# Patient Record
Sex: Female | Born: 1935 | Race: White | Hispanic: No
Health system: Southern US, Community
[De-identification: ages and names within clinical notes are randomized; demographics above are authoritative.]

---

## 2003-06-08 ENCOUNTER — Ambulatory Visit (HOSPITAL_COMMUNITY): Admission: RE | Admit: 2003-06-08 | Discharge: 2003-06-08 | Payer: Self-pay | Admitting: Neurology

## 2004-12-01 ENCOUNTER — Ambulatory Visit: Payer: Self-pay | Admitting: Family Medicine

## 2005-04-02 ENCOUNTER — Emergency Department: Payer: Self-pay | Admitting: Emergency Medicine

## 2005-12-08 ENCOUNTER — Ambulatory Visit: Payer: Self-pay | Admitting: Orthopaedic Surgery

## 2006-05-23 ENCOUNTER — Ambulatory Visit: Payer: Self-pay | Admitting: Ophthalmology

## 2006-05-24 ENCOUNTER — Other Ambulatory Visit: Payer: Self-pay

## 2006-05-24 ENCOUNTER — Inpatient Hospital Stay: Payer: Self-pay | Admitting: Internal Medicine

## 2006-07-06 ENCOUNTER — Inpatient Hospital Stay: Payer: Self-pay | Admitting: Internal Medicine

## 2006-07-06 ENCOUNTER — Other Ambulatory Visit: Payer: Self-pay

## 2006-07-07 ENCOUNTER — Other Ambulatory Visit: Payer: Self-pay

## 2006-10-17 ENCOUNTER — Emergency Department: Payer: Self-pay | Admitting: Emergency Medicine

## 2006-10-17 ENCOUNTER — Other Ambulatory Visit: Payer: Self-pay

## 2006-10-19 ENCOUNTER — Ambulatory Visit: Payer: Self-pay | Admitting: Neurology

## 2006-10-20 ENCOUNTER — Ambulatory Visit: Payer: Self-pay | Admitting: Unknown Physician Specialty

## 2006-11-20 ENCOUNTER — Ambulatory Visit: Payer: Self-pay | Admitting: Unknown Physician Specialty

## 2007-07-19 ENCOUNTER — Ambulatory Visit: Payer: Self-pay | Admitting: Unknown Physician Specialty

## 2007-11-05 ENCOUNTER — Ambulatory Visit: Payer: Self-pay

## 2007-11-20 ENCOUNTER — Ambulatory Visit: Payer: Self-pay | Admitting: Unknown Physician Specialty

## 2007-12-25 ENCOUNTER — Ambulatory Visit: Payer: Self-pay | Admitting: Pain Medicine

## 2008-03-15 ENCOUNTER — Emergency Department: Payer: Self-pay | Admitting: Emergency Medicine

## 2008-05-01 ENCOUNTER — Ambulatory Visit: Payer: Self-pay | Admitting: Internal Medicine

## 2008-06-14 ENCOUNTER — Emergency Department: Payer: Self-pay | Admitting: Emergency Medicine

## 2008-08-08 ENCOUNTER — Ambulatory Visit: Payer: Self-pay | Admitting: Unknown Physician Specialty

## 2009-03-19 ENCOUNTER — Ambulatory Visit: Payer: Self-pay | Admitting: Ophthalmology

## 2009-03-19 ENCOUNTER — Ambulatory Visit: Payer: Self-pay | Admitting: Cardiology

## 2009-04-07 ENCOUNTER — Ambulatory Visit: Payer: Self-pay | Admitting: Ophthalmology

## 2009-06-15 ENCOUNTER — Ambulatory Visit: Payer: Self-pay | Admitting: Ophthalmology

## 2009-06-29 ENCOUNTER — Ambulatory Visit: Payer: Self-pay | Admitting: Ophthalmology

## 2010-04-08 ENCOUNTER — Inpatient Hospital Stay: Payer: Self-pay | Admitting: Internal Medicine

## 2010-04-15 ENCOUNTER — Emergency Department: Payer: Self-pay | Admitting: Emergency Medicine

## 2010-04-24 ENCOUNTER — Encounter: Payer: Self-pay | Admitting: Neurology

## 2010-05-04 ENCOUNTER — Emergency Department: Payer: Self-pay | Admitting: Emergency Medicine

## 2011-05-08 ENCOUNTER — Emergency Department: Payer: Self-pay | Admitting: *Deleted

## 2011-06-12 IMAGING — CR PELVIS - 1-2 VIEW
1 series · 1 of 1 positions shown · non-contrast
Comparison: none

REASON FOR EXAM: fall apparent r hip /thigh pain
COMMENTS:

[view not recorded]
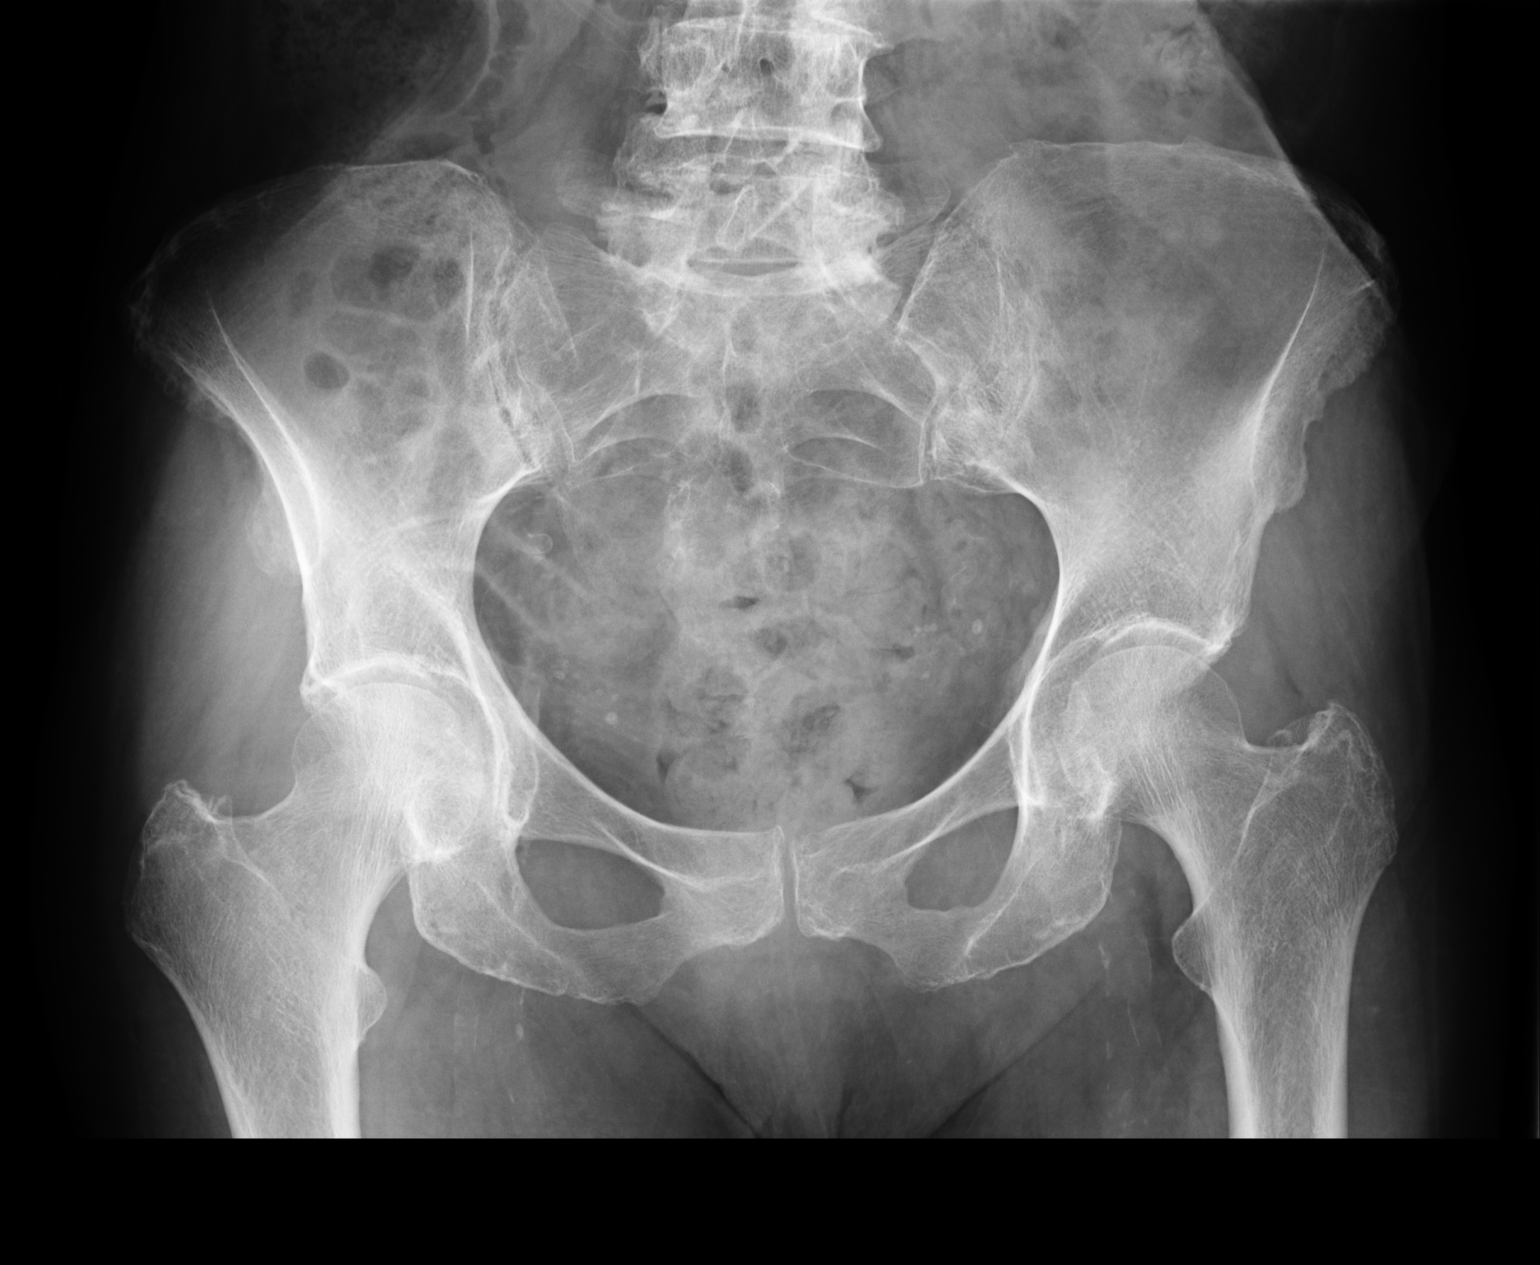

[1 of 1 positions shown; findings below may reference images not displayed]

PROCEDURE:     DXR - DXR PELVIS AP ONLY  - May 04, 2010  [DATE]

RESULT:     An AP view of the bony pelvis is compared to a prior exam of
04/15/2010. No fracture or other acute bony abnormality. There is a bony
prominence along the lateral aspect of the right ilium compatible with a
muscle attachment site. The hip joint spaces are well-maintained. No lytic
or blastic lesions are seen.
IMPRESSION: 1.     No acute changes are identified.

## 2011-11-11 LAB — COMPREHENSIVE METABOLIC PANEL
Alkaline Phosphatase: 93 U/L (ref 50–136)
Anion Gap: 6 — ABNORMAL LOW (ref 7–16)
Calcium, Total: 9.1 mg/dL (ref 8.5–10.1)
Chloride: 119 mmol/L — ABNORMAL HIGH (ref 98–107)
Co2: 29 mmol/L (ref 21–32)
Creatinine: 0.95 mg/dL (ref 0.60–1.30)
EGFR (Non-African Amer.): 55 — ABNORMAL LOW
Osmolality: 315 (ref 275–301)
SGOT(AST): 98 U/L — ABNORMAL HIGH (ref 15–37)
SGPT (ALT): 96 U/L — ABNORMAL HIGH (ref 12–78)

## 2011-11-11 LAB — TROPONIN I
Troponin-I: 0.04 ng/mL
Troponin-I: 0.03 ng/mL

## 2011-11-11 LAB — CBC WITH DIFFERENTIAL/PLATELET
Basophil %: 0.5 %
Eosinophil %: 1.1 %
HGB: 16.2 g/dL — ABNORMAL HIGH (ref 12.0–16.0)
Lymphocyte #: 2.2 10*3/uL (ref 1.0–3.6)
MCH: 31.7 pg (ref 26.0–34.0)
MCV: 91 fL (ref 80–100)
Monocyte #: 0.7 x10 3/mm (ref 0.2–0.9)
Neutrophil #: 6.3 10*3/uL (ref 1.4–6.5)
Neutrophil %: 67.4 %
Platelet: 171 10*3/uL (ref 150–440)
RBC: 5.12 10*6/uL (ref 3.80–5.20)

## 2011-11-12 ENCOUNTER — Inpatient Hospital Stay: Payer: Self-pay | Admitting: Internal Medicine

## 2011-11-12 LAB — POTASSIUM: Potassium: 4 mmol/L (ref 3.5–5.1)

## 2011-11-13 LAB — BASIC METABOLIC PANEL
Calcium, Total: 8.4 mg/dL — ABNORMAL LOW (ref 8.5–10.1)
Chloride: 118 mmol/L — ABNORMAL HIGH (ref 98–107)
Co2: 26 mmol/L (ref 21–32)
EGFR (African American): >60
EGFR (Non-African Amer.): >60
Glucose: 95 mg/dL (ref 65–99)
Osmolality: 305 (ref 275–301)
Potassium: 3.6 mmol/L (ref 3.5–5.1)
Sodium: 151 mmol/L — ABNORMAL HIGH (ref 136–145)

## 2011-11-13 LAB — HEPATIC FUNCTION PANEL A (ARMC)
Alkaline Phosphatase: 83 U/L (ref 50–136)
SGOT(AST): 26 U/L (ref 15–37)
SGPT (ALT): 48 U/L (ref 12–78)

## 2011-11-14 LAB — BASIC METABOLIC PANEL
Anion Gap: 7 (ref 7–16)
BUN: 17 mg/dL (ref 7–18)
Chloride: 108 mmol/L — ABNORMAL HIGH (ref 98–107)
Co2: 27 mmol/L (ref 21–32)
Creatinine: 0.88 mg/dL (ref 0.60–1.30)
EGFR (African American): >60
EGFR (Non-African Amer.): >60
Osmolality: 285 (ref 275–301)

## 2012-02-03 ENCOUNTER — Ambulatory Visit: Payer: Self-pay | Admitting: Internal Medicine

## 2012-02-24 ENCOUNTER — Inpatient Hospital Stay: Payer: Self-pay | Admitting: Student

## 2012-02-24 LAB — CBC
MCHC: 34.5 g/dL (ref 32.0–36.0)
Platelet: 171 10*3/uL (ref 150–440)
RBC: 5.3 10*6/uL — ABNORMAL HIGH (ref 3.80–5.20)
RDW: 13 % (ref 11.5–14.5)
WBC: 21.1 10*3/uL — ABNORMAL HIGH (ref 3.6–11.0)

## 2012-02-24 LAB — COMPREHENSIVE METABOLIC PANEL
Albumin: 3.9 g/dL (ref 3.4–5.0)
Alkaline Phosphatase: 91 U/L (ref 50–136)
BUN: 31 mg/dL — ABNORMAL HIGH (ref 7–18)
Bilirubin,Total: 1.8 mg/dL — ABNORMAL HIGH (ref 0.2–1.0)
Calcium, Total: 9.9 mg/dL (ref 8.5–10.1)
Co2: 29 mmol/L (ref 21–32)
Creatinine: 1.28 mg/dL (ref 0.60–1.30)
EGFR (Non-African Amer.): 39 — ABNORMAL LOW
Osmolality: 297 (ref 275–301)
SGPT (ALT): 23 U/L (ref 12–78)
Sodium: 144 mmol/L (ref 136–145)

## 2012-02-24 LAB — CK TOTAL AND CKMB (NOT AT ARMC)
CK, Total: 24 U/L (ref 21–215)
CK-MB: <0.5 ng/mL — ABNORMAL LOW (ref 0.5–3.6)

## 2012-02-24 LAB — URINALYSIS, COMPLETE
Nitrite: NEGATIVE
Ph: 8 (ref 4.5–8.0)
Specific Gravity: 1.016 (ref 1.003–1.030)
Transitional Epi: 1

## 2012-02-25 LAB — CBC WITH DIFFERENTIAL/PLATELET
Basophil #: 0 10*3/uL (ref 0.0–0.1)
Eosinophil #: 0.1 10*3/uL (ref 0.0–0.7)
HCT: 40.6 % (ref 35.0–47.0)
Lymphocyte #: 1.6 10*3/uL (ref 1.0–3.6)
Lymphocyte %: 12.3 %
MCH: 32.1 pg (ref 26.0–34.0)
MCHC: 35.5 g/dL (ref 32.0–36.0)
MCV: 91 fL (ref 80–100)
Monocyte #: 0.9 x10 3/mm (ref 0.2–0.9)
Monocyte %: 7.1 %
Neutrophil #: 10.3 10*3/uL — ABNORMAL HIGH (ref 1.4–6.5)
Platelet: 134 10*3/uL — ABNORMAL LOW (ref 150–440)
RDW: 12.8 % (ref 11.5–14.5)
WBC: 12.9 10*3/uL — ABNORMAL HIGH (ref 3.6–11.0)

## 2012-02-25 LAB — MAGNESIUM: Magnesium: 2.1 mg/dL

## 2012-02-26 LAB — URINE CULTURE

## 2012-02-28 ENCOUNTER — Inpatient Hospital Stay: Payer: Self-pay | Admitting: Student

## 2012-02-28 LAB — URINALYSIS, COMPLETE
Ketone: NEGATIVE
Nitrite: NEGATIVE
Ph: 5 (ref 4.5–8.0)
Protein: 30
Squamous Epithelial: NONE SEEN

## 2012-02-28 LAB — CBC
HCT: 45.7 % (ref 35.0–47.0)
HGB: 15.4 g/dL (ref 12.0–16.0)
MCH: 30.6 pg (ref 26.0–34.0)
MCV: 91 fL (ref 80–100)
RBC: 5.02 10*6/uL (ref 3.80–5.20)
RDW: 12.9 % (ref 11.5–14.5)

## 2012-02-28 LAB — MAGNESIUM: Magnesium: 2.5 mg/dL — ABNORMAL HIGH

## 2012-02-28 LAB — CK TOTAL AND CKMB (NOT AT ARMC): CK-MB: <0.5 ng/mL — ABNORMAL LOW (ref 0.5–3.6)

## 2012-02-28 LAB — COMPREHENSIVE METABOLIC PANEL
Anion Gap: 8 (ref 7–16)
Bilirubin,Total: 0.9 mg/dL (ref 0.2–1.0)
Calcium, Total: 9.4 mg/dL (ref 8.5–10.1)
Chloride: 120 mmol/L — ABNORMAL HIGH (ref 98–107)
Co2: 23 mmol/L (ref 21–32)
EGFR (African American): 48 — ABNORMAL LOW
EGFR (Non-African Amer.): 41 — ABNORMAL LOW
Osmolality: 313 (ref 275–301)
SGOT(AST): 19 U/L (ref 15–37)
Sodium: 151 mmol/L — ABNORMAL HIGH (ref 136–145)

## 2012-02-28 LAB — TROPONIN I: Troponin-I: <0.02 ng/mL

## 2012-02-28 LAB — APTT: Activated PTT: 26.6 secs (ref 23.6–35.9)

## 2012-02-28 LAB — LIPASE, BLOOD: Lipase: 105 U/L (ref 73–393)

## 2012-02-28 LAB — PROTIME-INR: INR: 1.1

## 2012-02-29 LAB — CBC WITH DIFFERENTIAL/PLATELET
Basophil #: 0.1 10*3/uL (ref 0.0–0.1)
Eosinophil #: 0.1 10*3/uL (ref 0.0–0.7)
Eosinophil %: 1.2 %
HGB: 13.1 g/dL (ref 12.0–16.0)
Lymphocyte #: 1.7 10*3/uL (ref 1.0–3.6)
MCH: 31.9 pg (ref 26.0–34.0)
MCHC: 35.3 g/dL (ref 32.0–36.0)
Neutrophil #: 6.6 10*3/uL — ABNORMAL HIGH (ref 1.4–6.5)
Neutrophil %: 70.1 %
Platelet: 124 10*3/uL — ABNORMAL LOW (ref 150–440)
RBC: 4.12 10*6/uL (ref 3.80–5.20)
RDW: 12.7 % (ref 11.5–14.5)

## 2012-02-29 LAB — COMPREHENSIVE METABOLIC PANEL
Alkaline Phosphatase: 58 U/L (ref 50–136)
Anion Gap: 6 — ABNORMAL LOW (ref 7–16)
BUN: 26 mg/dL — ABNORMAL HIGH (ref 7–18)
Calcium, Total: 8.7 mg/dL (ref 8.5–10.1)
Chloride: 120 mmol/L — ABNORMAL HIGH (ref 98–107)
Co2: 24 mmol/L (ref 21–32)
EGFR (African American): >60
EGFR (Non-African Amer.): >60
Osmolality: 304 (ref 275–301)
Potassium: 3.5 mmol/L (ref 3.5–5.1)
SGOT(AST): 19 U/L (ref 15–37)
Sodium: 150 mmol/L — ABNORMAL HIGH (ref 136–145)

## 2012-02-29 LAB — URINE CULTURE

## 2012-03-01 LAB — BASIC METABOLIC PANEL
Anion Gap: 6 — ABNORMAL LOW (ref 7–16)
BUN: 22 mg/dL — ABNORMAL HIGH (ref 7–18)
Chloride: 114 mmol/L — ABNORMAL HIGH (ref 98–107)
Co2: 25 mmol/L (ref 21–32)
Creatinine: 0.74 mg/dL (ref 0.60–1.30)
EGFR (African American): >60
Glucose: 123 mg/dL — ABNORMAL HIGH (ref 65–99)
Osmolality: 293 (ref 275–301)

## 2012-03-01 LAB — CULTURE, BLOOD (SINGLE)

## 2012-03-04 ENCOUNTER — Ambulatory Visit: Payer: Self-pay | Admitting: Internal Medicine

## 2012-03-05 LAB — CULTURE, BLOOD (SINGLE)

## 2012-05-05 DEATH — deceased

## 2013-04-04 IMAGING — CR DG CHEST 2V
1 series · 2 of 2 positions shown · non-contrast
Comparison: none

REASON FOR EXAM: fever
COMMENTS:

[Series 1: x chest ap · 0.14mm/px · 2 of 2 slices shown]
[im 1/2]
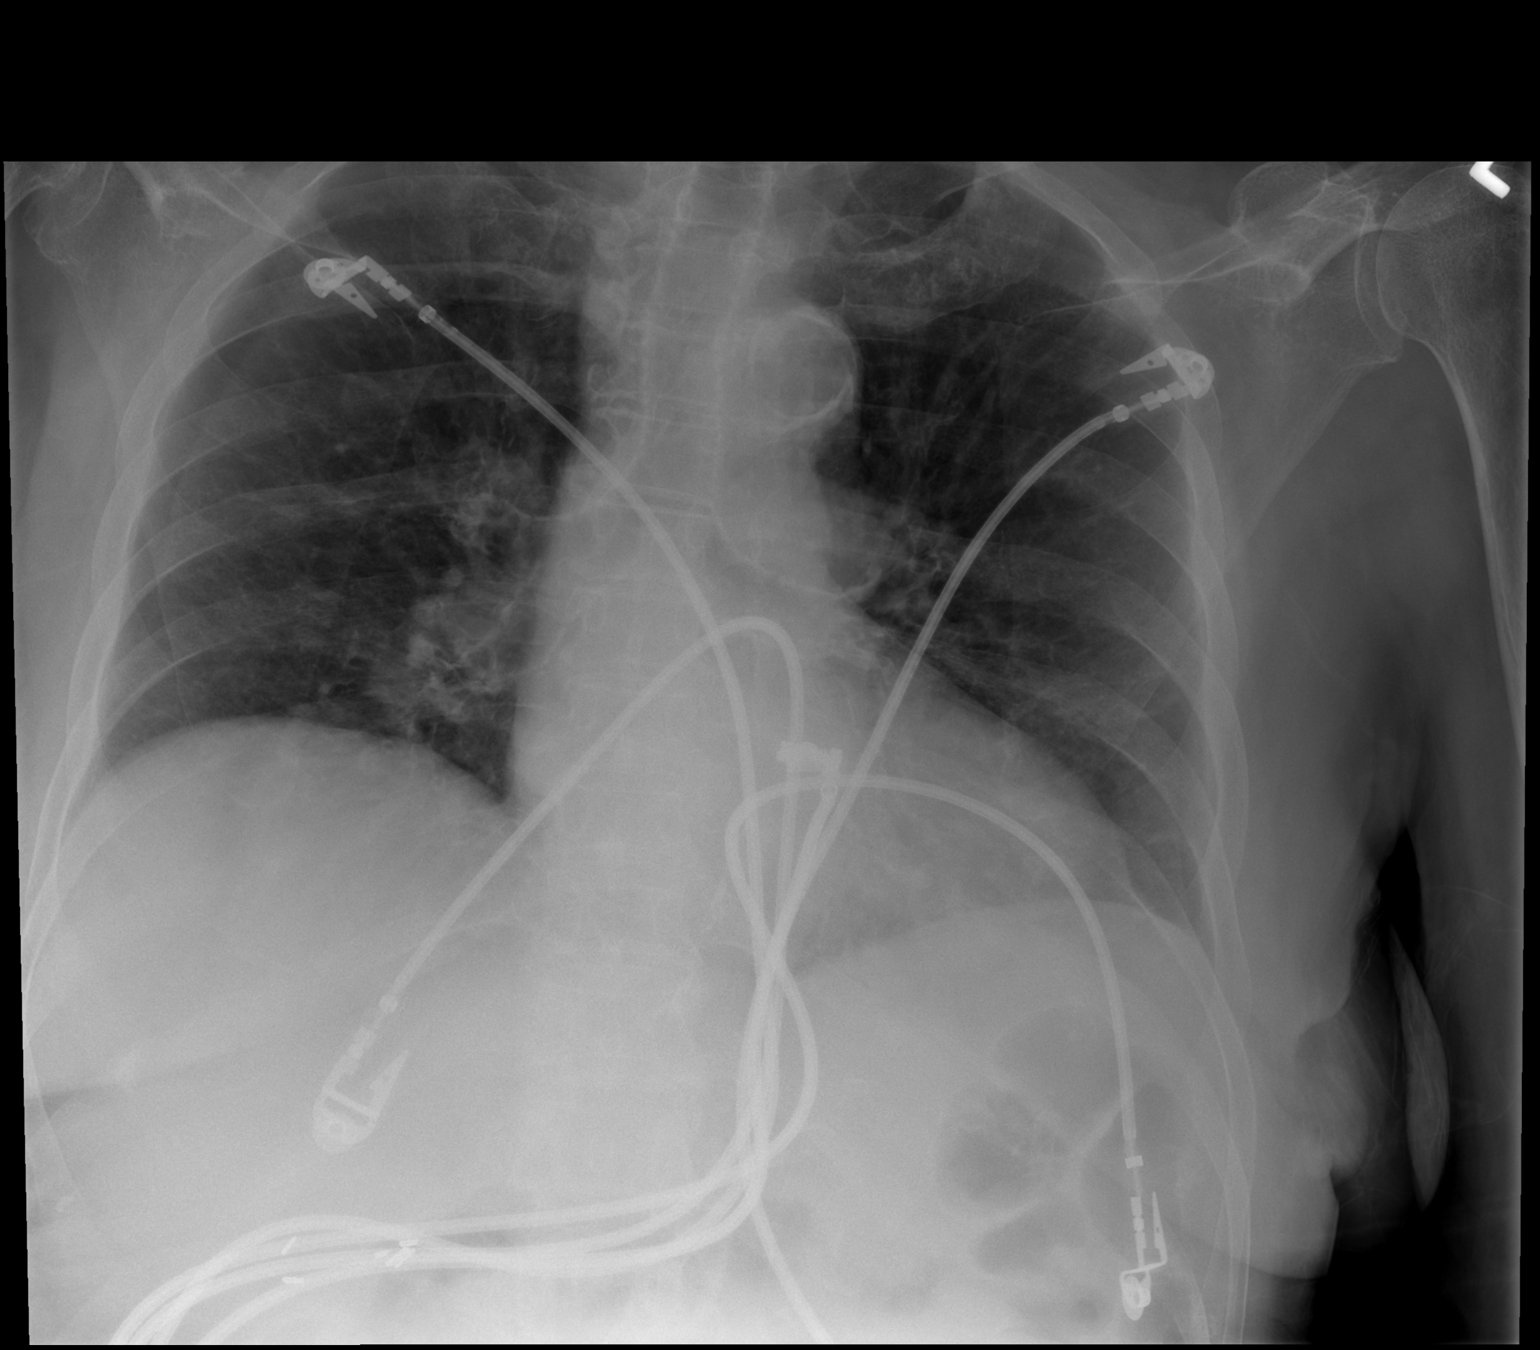
[im 2/2]
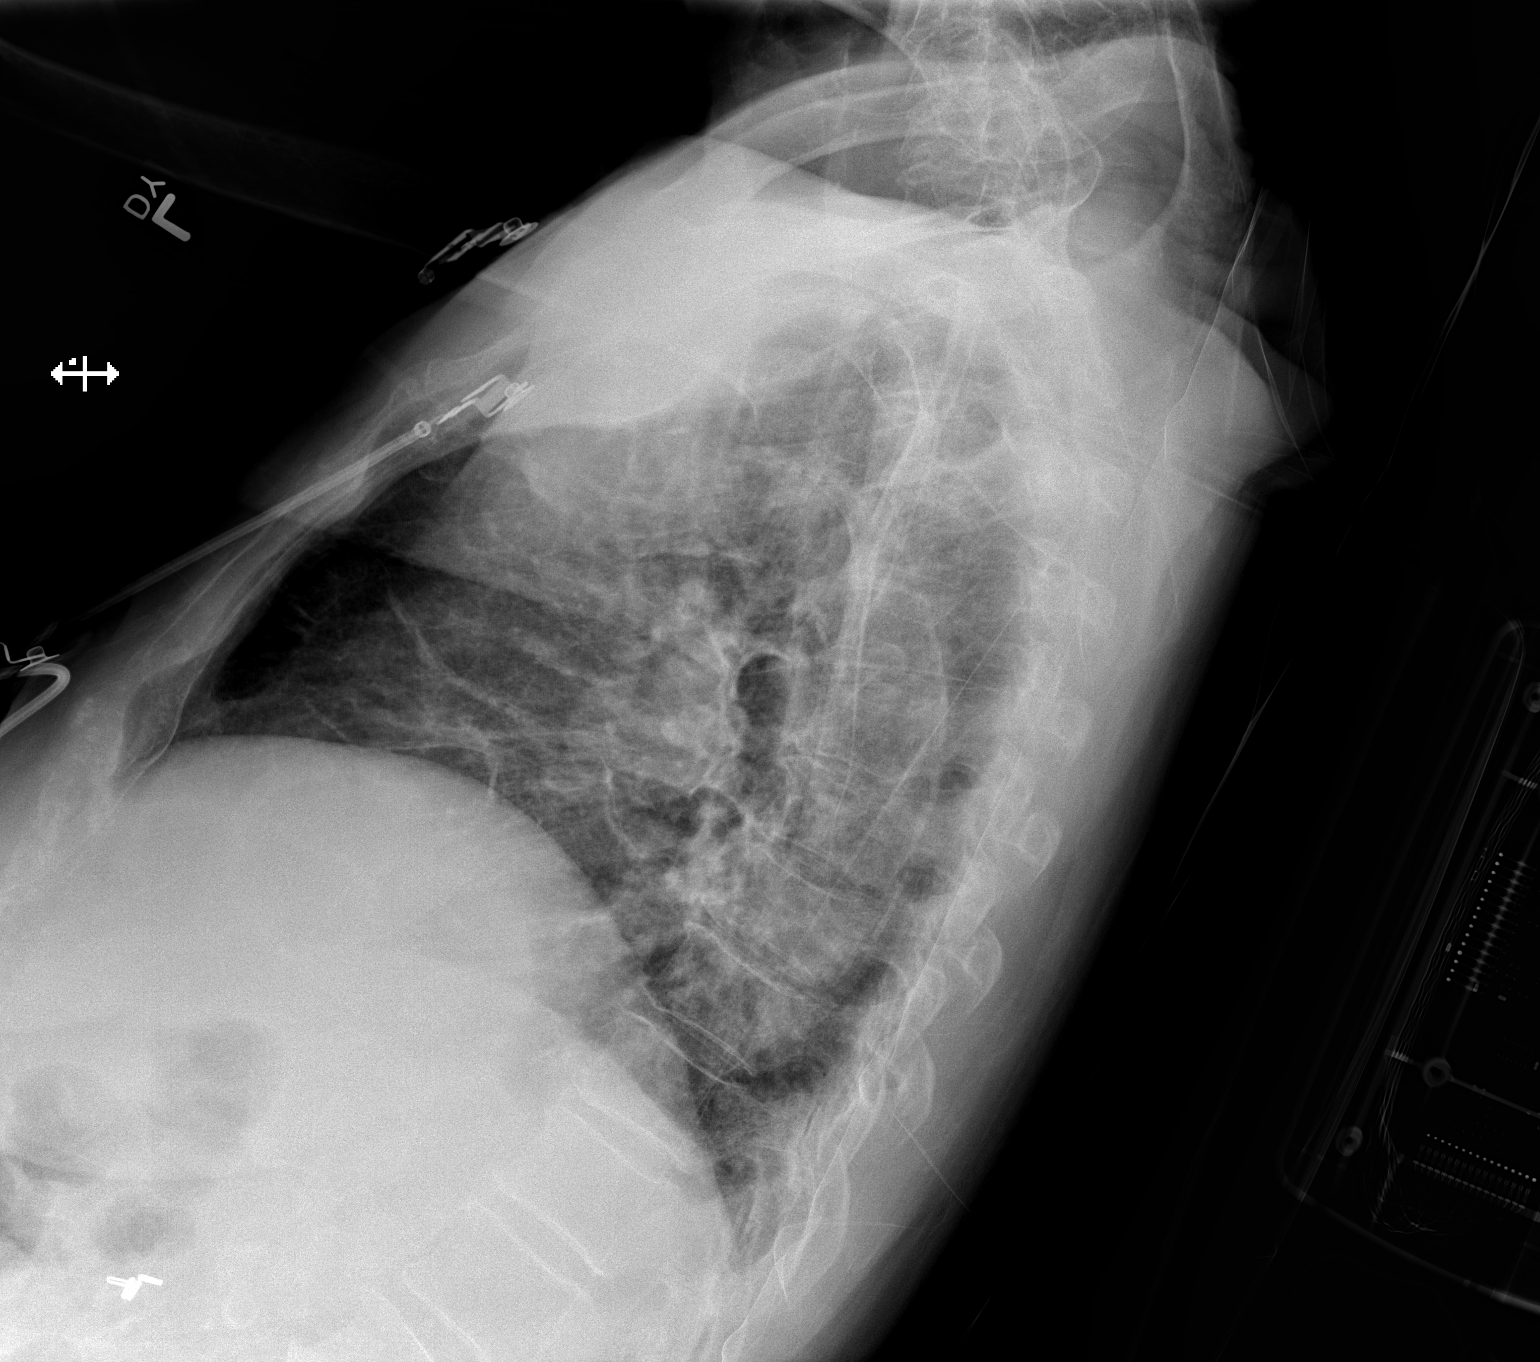

[2 of 2 positions shown; findings below may reference images not displayed]

PROCEDURE:     DXR - DXR CHEST PA (OR AP) AND LATERAL  - February 25, 2012  [DATE]

RESULT:     Comparison is made to the study 24 February, 2012.

Prominent atherosclerotic calcification is present. The cardiac silhouette
appears normal. Cardiac monitoring electrodes are present. There is no overt
edema, infiltrate, effusion or pneumothorax.
IMPRESSION: 1. No acute cardiopulmonary disease. Atherosclerotic changes are present.

[REDACTED]

## 2014-07-22 NOTE — Discharge Summary (Signed)
PATIENT NAMESHERLENE, [name] MR#:  [id number] DATE OF BIRTH:  1926/08/30  DATE OF ADMISSION:  02/24/2012 DATE OF DISCHARGE:  02/26/2012  CHIEF COMPLAINT: Fever, vomiting, altered mental status.   PRIMARY CARE PHYSICIAN: Dr. Lavetta Nielsen with [facility]    DISCHARGE DIAGNOSES:  1. Sepsis urinary tract infection with Proteus organism.  2. Atrial fibrillation with RVR.  3. Hypertension.  4. Advanced dementia.  5. Hypothyroidism  6. Osteoarthritis. 7. Allergic rhinitis.  8. Hyperlipidemia. 9. Fibromyalgia. 10. Chronic fatigue syndrome.  11. History of recurrent atrial fibrillation.   DISCHARGE MEDICATIONS:  1. Sertraline 25 mg daily.   2. Metoprolol tartrate 75 mg 2 times a day.  3. Levothyroxine 88 mcg daily.  4. Tikosyn 250 mcg one cap every 12 hours.  5. Senna lax 1 tab 2 to 3 times a week as needed for stool softener. 6. Tiazac 180 mg 24 hours extended-release 1 tab daily at bedtime.  7. Ensure Plus three times a day.  8. Levaquin 250 mg 1 tab every 24 hours x2 days only.   DO NOT TAKE: Please stop taking Diflucan.  DIET: Low sodium, mechanical soft, honey thick liquids.   ACTIVITY: As tolerated.   FOLLOW-UP: Please follow with [facility] physician in 1 to 2 days and follow with your cardiologist within 1 to 2 weeks.   HISTORY OF PRESENT ILLNESS: For full details of history and physical, please see the dictation by Dr. Sherryll Burger on 02/24/2012. Briefly, this is patient with known history of Alzheimer's dementia, hypertension, hypothyroidism, and atrial fibrillation who presented with the above chief complaint. The patient had some nausea and vomiting and was more altered than her baseline and, therefore, was admitted to the hospitalist service. The patient had a temperature of 100.5 on arrival and had dirty urine, was admitted to the hospitalist service.   LABS AND IMAGING: Initial BUN 31, creatinine 1.28. LFTs bilirubin 1.3, otherwise within normal limits. Troponin negative  x1. TSH 1.37. Initial WBC 21.1. On November 23rd WBC was 12.9. Urine cultures were growing Proteus mirabilis sensitive to Levaquin. Blood cultures have been no growth to date x2 from November 22nd. Urinalysis on arrival 3+ leukocyte esterase, 890 WBCs, 3+ bacteria, 256 RBCs.  X-ray of the chest, PA and lateral, on November 23rd no acute cardiopulmonary disease.   HOSPITAL COURSE: The patient was admitted to the hospitalist service. Blood cultures and urine cultures were sent on arrival and the patient was started on Levaquin. The patient's blood cultures have been no growth to date and urine cultures came back with mirabilis which is susceptible to the Levaquin which has been started prior. Today is day three of the Levaquin and she has had a dose already. Will treat her for five days with Levaquin. Her fever and leukocytosis resolved. The patient has very advanced dementia and is being followed by [facility] which whom she should follow-up as an outpatient. The patient did have sepsis per criteria. Blood pressure remained stable. The patient did have a bout of atrial fibrillation with RVR which needed diltiazem IV x1. Currently she is back to sinus and she is instructed to follow with her outpatient cardiologist which manages the Tikosyn. The patient is on Tikosyn, metoprolol, and Cardizem.   OVERALL PROGNOSIS: Poor with advanced dementia.  CODE STATUS: The patient is a FULL CODE.       TOTAL TIME SPENT: 45 minutes.   ____________________________ Krystal Eaton, MD sa:drc D: 02/26/2012 14:20:34 ET T: 02/27/2012 12:14:15 ET JOB#: [id number]  cc: [email or web address]  Jacques NavyAhmadzia, MD, <Dictator> Kristopher OppenheimJane D. Shawn StallHollingsworth, MD Krystal EatonSHAYIQ Jaretzy Lhommedieu MD ELECTRONICALLY SIGNED 03/15/2012 11:09

## 2014-07-22 NOTE — H&P (Signed)
PATIENT NAME:  [name], [name] MR#:  [id number] DATE OF BIRTH:  12-02-1926  DATE OF ADMISSION:  02/24/2012  PRIMARY CARE PHYSICIAN: Dr. Lavetta [facility]  REQUESTING PHYSICIAN: Dr. Carollee Massed   CHIEF COMPLAINT: Fever, vomiting, and altered mental status.   HISTORY OF PRESENT ILLNESS: The patient is an 81 year old female with a known history of Alzheimer's dementia, hypertension, and hypothyroidism being admitted for SIRS likely due to urinary tract infection.  The patient was prescribed Diflucan yesterday by her PCP for possible urinary tract infection, for possible yeast infection in the urine, but had worsening altered mental status today. As per daughter, the patient was becoming more restless and she was unable to arouse her this morning. The patient also had some vomiting. EMS was called and she was brought into the Emergency Department. As per the record, the patient had a fever for last three days as well. While in the ED, she was found to have leukocytosis with a white count of 21.1. Her urinalysis looks infected and she is being admitted for further evaluation and management. She also had a temperature 100.5 while in the ED.   PAST MEDICAL HISTORY:  1. Alzheimer's dementia. 2. Hypertension.  3. Hypothyroidism.  4. Osteoarthritis. 5. Allergic rhinitis. 6. Hyperlipidemia.  7. Fibromyalgia. 8. Chronic fatigue syndrome. 9. History of recurrent atrial fibrillation.   REVIEW OF SYSTEMS: Unobtainable secondary to the patient's severe dementia, which is at her baseline.   PAST SURGICAL HISTORY:  1. Cholecystectomy. 2. Hysterectomy. 3. Bilateral carpal tunnel repair. 4. Cataract surgery.   SOCIAL HISTORY: Former smoker. She did not smoke for many years. No alcoholism. She lives at home with her daughter. She is followed by [facility]. She is widowed.    FAMILY HISTORY: Father died from prostate cancer. Mother died from unknown cancer at the age of 57.    ALLERGIES:  Dilaudid, sulfa, Sudafed, and penicillin.   MEDICATIONS AT HOME:  1. Tikosyn 250 mcg p.o. b.i.d.  2. Diflucan 100 mg p.o. daily which was started yesterday.  3. Ensure Plus 3 times a day.  4. Levothyroxine 88 mcg p.o. daily.  5. Metoprolol 50 mg, 1-1/2 tablets p.o. b.i.d.  6. Senna-Lax 1 tablet p.o. 2 to 3 times a week as needed.  7. Sertraline 25 mg p.o. daily.  8. Tiazac 180 mg p.o. at bedtime.   PHYSICAL EXAMINATION:  VITAL SIGNS: Temperature 100.4, heart rate 76 per minute, respirations 18 per minute, blood pressure 137/69 mmHg. She is saturating 98% on room air.   GENERAL: The patient is an 81 year old female lying in bed, nonverbal. She is in no acute distress.   EYES:  Pupils equal, round, reactive to light and accommodation. No scleral icterus. Extraocular muscles intact.   HENT: Head atraumatic, normocephalic. Oropharynx and nasopharynx clear.   NECK:  Supple.  No jugular venous distention. No thyroid enlargement or tenderness.   LUNGS: Clear to auscultation bilaterally. No wheezing, rales, rhonchi, or crepitation.   CARDIOVASCULAR: S1, S2 normal. No murmurs, rubs, or gallops.   ABDOMEN: Soft, nontender, nondistended. Bowel sounds present. No organomegaly.  EXTREMITIES:  No pedal edema, cyanosis, or clubbing.   NEUROLOGIC: Nonfocal examination She is very demented, makes physical examination difficult for full neuro evaluation.  She did state she feels okay once.   PSYCHIATRIC: Unable to evaluate as she has advanced dementia. She is alert but not oriented.   LABORATORY, DIAGNOSTIC, AND RADIOLOGICAL DATA: Normal BMP. Normal liver function tests except total bilirubin 1.8. Normal first set  of cardiac enzymes. CBC showed white count 21.1, hemoglobin 16.6, hematocrit 48.2, platelets 171. Urinalysis showed 3+ bacteria, 890 WBCs, 3+ leukocyte esterase, negative nitrite, WBCs in clumps present. Lactic acid of 2.2.   Chest x-ray while in the Emergency Department  showed no acute cardiopulmonary disease.   IMPRESSION AND PLAN:  1. SIRS likely due to urinary tract infection with fever, leukocytosis, and altered mental status. We will start antibiotic.  2. Urinary tract infection.  We will start on IV Levaquin, obtain urine culture and sensitivity. This is based on urinalysis.  3. Hypertension. Blood pressure is stable. We will continue home medication. 4. Leukocytosis. Likely due to urinary tract infection. We will monitor while on antibiotic. Repeat chest x-ray in the morning.  5. Dementia at her baseline, somewhat worsening. Could be due to infection. 6. Hypothyroidism. Will check TSH. Continue Synthroid.   7.  Atrial Fibrillation: rate controlled for now, continue cardizem and tikosyn, consider cardizem iv and possible drip if rate goes uncontrolled. 8.  CODE STATUS: FULL CODE, which was confirmed by her caregiver who was at the bedside.   TOTAL TIME SPENT:  55 minutes. ____________________________ Ellamae SiaVipul S. Sherryll BurgerShah, MD vss:bjt D: 02/24/2012 14:42:32 ET T: 02/24/2012 15:03:18 ET JOB#: [id number]  cc: Kristopher OppenheimJane D. Shawn StallHollingsworth, MD Ellamae SiaVIPUL S [facility] MD ELECTRONICALLY SIGNED 02/24/2012 21:26

## 2014-07-22 NOTE — Discharge Summary (Signed)
PATIENT NAMToula Moos:  Baldwin, Claudia MR#:  [id number] DATE OF BIRTH:  03-22-1927  DATE OF ADMISSION:  02/28/2012 DATE OF DISCHARGE:  03/02/2012  CHIEF COMPLAINT: Altered mental status and fever.   DISCHARGE DIAGNOSES:  1. Sepsis.  2. Urinary tract infection.  3. Urinary retention.  4. Advanced dementia.  5. Paroxysmal atrial fibrillation.  6. Hyponatremia likely from poor p.o. intake.  7. Hypertension.  8. Hypokalemia.  9. Hypothyroidism.  10. Osteoarthritis.  11. Allergic rhinitis.  12. Hyperlipidemia.  13. History of fibromyalgia. 14. Chronic fatigue syndrome.   DISCHARGE MEDICATIONS:  1. Levaquin 250 mg daily p.o. for two days only.  2. Sertraline 25 mg daily.  3. Metoprolol tartrate 75 mg twice a day. 4. Levothyroxine 88 mcg daily. 5. Tikosyn 250 mcg one cap every 12 hours.  6. Senna Lax one tablet orally 2 to 3 times a week as needed for stool softener.  7. Tiazac 80 mg/per 24 hour extended-release one tab daily.  8. Ensure Plus three times daily. 9. Levaquin 250 mg daily for two days only.  10. The patient will be going home with a Foley to leg bag.   DIET: Low sodium, mechanical soft. No mixed consistencies. Honey thick liquids.   ACTIVITY: As tolerated.   DISCHARGE FOLLOWUP: Please followup with your primary care physician within 1 to 2 weeks as well as cardiologist. Resume hospice with [facility] upon discharge.  HISTORY OF PRESENT ILLNESS/HOSPITAL COURSE: The patient is an 81 year old female with very advanced Alzheimer dementia with poor functional status at baseline, mainly bedridden, hypertension, hypothyroidism, and paroxysmal atrial fibrillation with recent admission and discharge for urinary tract infection who came back with fever and altered mental status. Of note, she was just discharged with Proteus urinary tract infection with Levaquin; however, the patient's daughter, who takes care of her and is the main caretaker, stated that she was not herself and she  was not able to give her the remaining Levaquin. She presented back with a fever and up-trending leukocytosis. She was also noted to have hypernatremia with sodium of 150 on arrival and admitted to the hospitalist service. The patient was started on gentle IV fluids and initially started on vancomycin and Levaquin here. Blood cultures and urine cultures were repeated and checked, and X-ray was obtained. With the above treatment, the leukocytosis resolved and the patient is afebrile. Blood cultures and urine cultures have been no growth to date. Of note, the patient's previous urine cultures were Proteus and susceptible to Levaquin. X-ray of the chest was also not suggestive of an infection, on arrival. With IV fluids and potassium replacement, her electrolytes have improved. She is back to her baseline, but overall is very debilitated and has poor p.o. intake and is at high risk for rehospitalization and poor outcome. The daughter is aware. She initially made the patient DNR, but then changed her mind and states that she believes in power of prayer and it was "in the Lord's hands".  At this point, she will be discharged back with resumption of hospice and [facility] program. Her prognosis overall appears to be very poor. ____________________________ Krystal EatonShayiq Tycen Dockter, MD sa:slb D: 03/02/2012 14:52:06 ET T: 03/02/2012 15:00:15 ET JOB#: [id number]  cc: Krystal EatonShayiq Damion Kant, MD, <Dictator> Krystal EatonSHAYIQ Uel Davidow MD ELECTRONICALLY SIGNED 03/15/2012 11:12

## 2014-07-22 NOTE — H&P (Signed)
PATIENT NAME:  [name], [name] MR#:  [id number] DATE OF BIRTH:  1926/07/16  DATE OF ADMISSION:  02/28/2012  REFERRING PHYSICIAN: Dr. Governor Rooks  PRIMARY CARE PHYSICIAN: Dr. Lavetta Nielsen   CHIEF COMPLAINT: Altered mental status and fever.   HISTORY OF PRESENT ILLNESS: This is an 81 year old female with known history of advanced Alzheimer dementia with poor functional status in a wheelchair occasionally, mainly bedridden, hypertension, hypothyroidism, atrial fibrillation, who was recently discharged from [facility] for sepsis secondary to urinary tract infection. Patient presents with altered mental status. Patient was discharged home on the 24th. Patient has been taken care of by her daughter. Daughter reports patient has been doing good Sunday evening and Monday morning, but then she reports she has been more lethargic, less responsive, not able to tolerate any p.o. intake or fluid intake, which prompted her to bring her to ED. In ED patient was found to be febrile with fever 102.3. As well she was found to have mild leukocytosis 16.9 which is worse than her baseline upon discharge which was 12.9. Patient had Foley catheter inserted where she had 1700 mL urine output after the Foley insertion. As well patient's urinalysis came back significant for urinary tract infection with leukocyte esterase +3 and white blood cells 313. Patient's most recent blood culture during previous admission did show pan sensitive proteus growing in urine culture. As well patient had significant hyponatremia in her lab of 151. Hospitalist service was requested to admit the patient for evaluation and management of the patient's sepsis.   PAST MEDICAL HISTORY:  1. Alzheimer's dementia.  2. Hypertension.  3. Hypothyroidism.  4. Osteoarthritis.  5. Allergic rhinitis.  6. Hyperlipidemia.  7. Fibromyalgia.  8. Chronic fatigue syndrome.  9. History of recurrent atrial fibrillation.   PAST SURGICAL HISTORY:   1. Cholecystectomy.  2. Hysterectomy.  3. Bilateral carpal tunnel repair.  4. Cataract surgery.   REVIEW OF SYSTEMS: Unable to obtain secondary to patient's severe dementia and altered mental status.    SOCIAL HISTORY: Currently patient lives with her daughter who is taking care of her at home. Patient had history of smoking so many years ago. No history of alcohol or illicit drug use.   FAMILY HISTORY: Significant for prostate cancer in her father and cancer in the mother, unknown.   ALLERGIES: Dilaudid, sulfa, Sudafed, penicillin. (Patient tolerated Zosyn in ED without any complications.)   HOME MEDICATIONS:  1. Tikosyn 250 mcg oral p.o. b.i.d.  2. Sertraline 25 mg oral daily.  3. Metoprolol 75 mg 2 times a day.  4. Levothyroxine 88 mcg oral daily.  5. Senna 2 to 3 tablets every week as needed for constipation.  6. Cardizem ER 180 mg daily at bedtime.  7. Levaquin 250 mg 1 tablet daily, today was supposed to be the last dose.  8. Ensure Plus 3 times a day.   PHYSICAL EXAMINATION:  VITAL SIGNS: Temperature 102.3, pulse 89, respiratory rate 20, blood pressure 139/64, saturating 96% on room air.   GENERAL: Elderly, frail 81 year old female laying in bed, nonverbal, in no apparent distress.   HEENT: Head atraumatic, normocephalic. Pupils equal, reactive to light. Pink conjunctivae. Anicteric sclerae. Extraocular muscle intact. Dry oral mucosa and dry lips.   NECK: Supple. No thyromegaly. No JVD.   CHEST: Good air entry bilaterally. No wheezing, rales, rhonchi.   CARDIOVASCULAR: S1, S2 heard. No rubs, murmur, gallops.   ABDOMEN: Soft, nontender, nondistended. Bowel sounds present.   EXTREMITIES: No edema. No clubbing. No cyanosis.  PSYCHIATRIC: Unable to evaluate secondary to advanced dementia. She is awake but not oriented, not able to answer any questions and minimally verbal.   NEUROLOGIC: Nonfocal examination. She is very demented, unable to have appropriate examination.    LABORATORY, DIAGNOSTIC AND RADIOLOGICAL DATA: Glucose 170, BUN 37, creatinine 1.21, sodium 151, potassium 4, chloride 120, CO2 23, protein 6.9, albumin 3.2. Troponin less than 0.02. White blood cell 16.9, hemoglobin 15.4, hematocrit 45.7, platelets 202. INR 1.1. Urinalysis showing 313 white blood cells and +3 leukocyte esterase. EKG showing sinus tachycardia and nonspecific T wave changes. Chest x-ray does not show any opacity or infiltrate. Awaiting official reading.    ASSESSMENT AND PLAN:  1. Sepsis. This is most likely due to urinary tract infection, as the patient is having fever, leukocytosis and altered mental status. Blood cultures were sent. Urine cultures are still pending. Chest x-ray does not show any significant infiltrate.  2. Urinary tract infection. Will start patient on IV vancomycin and Levaquin. Will await results on blood and urine cultures prior to changing antibiotics. 3. Urinary retention. Patient had 1700 mL output after Foley insertion. Will continue with Foley until patient has improved mental status and then will try voiding trial.  4. Dementia. Patient has history of advanced Alzheimer's dementia. This could be worsened at this point secondary to her infection. Will monitor closely.  5. Hypothyroidism. Continue with Synthroid.  6. Atrial fibrillation, recurrent. Currently in normal sinus, rate control. Will continue patient on home medication metoprolol, Cardizem and Tikosyn.   7. Hypernatremia. This is secondary to patient's dehydration and volume depletion. Received 1 liter of IV normal saline. Will continue her on D5 half-normal saline 85 mL/h.   8. Nutrition. Secondary to her altered mental status will keep her n.p.o. other than medication, and once her mentation improves will be resumed on Ensure 3 times a day and puree diet.  9. CODE STATUS: Patient is FULL CODE. Discussed with daughter. As well she requests case management consult to see if it they are available to  increase patient care hours at home.   TOTAL TIME SPENT ON ADMISSION AND PATIENT CARE: 60 minutes.   ____________________________ Starleen Armsawood S. Hillary Struss, MD dse:cms D: 02/28/2012 08:53:48 ET T: 02/28/2012 09:14:41 ET JOB#: [id number]  cc: Starleen Armsawood S. Beulah Capobianco, MD, <Dictator> Kristopher OppenheimJane D. Shawn StallHollingsworth, MD Ivonne Freeburg Teena IraniS Loana Salvaggio MD ELECTRONICALLY SIGNED 03/01/2012 0:47

## 2014-07-22 NOTE — H&P (Signed)
PATIENT NAME:  [name], [name] MR#:  [id number] DATE OF BIRTH:  [birth year]  DATE OF ADMISSION:  11/12/2011  PRIMARY CARE PHYSICIAN: Lavetta Nielsen, MD   CHIEF COMPLAINT: Altered mental state.  HISTORY OF PRESENT ILLNESS: Ms. Bango is an 81 year old Caucasian female who lives at home with her daughter. The patient has Alzheimer dementia. She is bedridden and wheelchair bound as well. She was transported via EMS to the emergency department for evaluation excessive lethargy, sleepiness, and altered mental state. The daughter had noticed that the patient became lethargic after taking quetiapine. Her primary care physician was notified and she reduced the dose to 1/2 tablet three times per week; Monday, Wednesday, and Friday. According to the daughter even with a 1/2 tablet she noticed that Monday she became also sleepy and lethargic. This time the patient was at a daycare center and she received another dose off quetiapine and apparently subsequently she became lethargic and sleepy with altered mental status. Additionally, work-up here in the hospital also reveals evidence of dehydration and also hypernatremia with a sodium reaching 154 and slight elevation of liver transaminases. The patient was admitted for further evaluation and monitoring.   REVIEW OF SYSTEMS: A 10-point system review is unobtainable due to the patient's lethargy, sleepiness, and dementia. She is arousable but does not give any meaningful history.   PAST MEDICAL HISTORY:  1. Alzheimer dementia. 2. Hypertension.  3. Hypothyroidism.  4. Osteoarthritis. 5. Allergic rhinitis. 6. Hyperlipidemia. 7. Fibromyalgia. 8. Chronic fatigue syndrome. 9. History of recurrent atrial fibrillation, had previous work-up at [facility].   PAST SURGICAL HISTORY:  1. Cholecystectomy.  2. Hysterectomy. 3. Bilateral carpal tunnel repair.  4. Cataract surgery.   SOCIAL HABITS: Ex-chronic smoker, but she did not smoke for many years.  No history of alcoholism or other drug abuse.   SOCIAL HISTORY: She is widowed and lives at home with her daughter.   FAMILY HISTORY: Her father died from prostatic cancer. Her mother died from unknown cancer at age of 36.   ADMISSION MEDICATIONS:  1. Tikosyn 250 mg twice a day. 2. Tiazac 180 mg once a day. 3. Metoprolol 75 mg twice a day. 4. Sertraline 25 mg once a day. 5. Quetiapine 25 mg taking 1/2 tablet now three times per week; Monday, Wednesday, and Friday.  6. Senna Lax 1 tablet 2 to 3 times a week p.r.n.  7. Levothyroxine 88 mcg once a day.   ALLERGIES: Reported to be Dilaudid, sulfa, Sudafed, and penicillin.   PHYSICAL EXAMINATION:   VITAL SIGNS: Blood pressure 125/60, respiratory rate 18, pulse 58, temperature 99.3, and oxygen saturation 96%.   GENERAL APPEARANCE: Elderly female lying in bed sleeping, in no acute distress.   HEAD AND NECK EXAMINATION: No pallor. No icterus. No cyanosis.   EARS, NOSE, AND THROAT: Hearing cannot be assessed due to dementia and lethargy and sleepiness. Nasal mucosa, lips, and tongue were normal, except for mild dryness of mucous membranes.   EYES: Normal eyelids and conjunctiva. Pupils are very small, about a few millimeters. I could not detect reactivity to light.   NECK: Supple. Trachea at midline. No thyromegaly. No cervical lymphadenopathy. No masses.   HEART: Normal S1 and S2. No S3 or S4. No murmur. No gallop. No carotid bruits.   RESPIRATORY: Normal breathing pattern without use of accessory muscles. No rales. No wheezing.   ABDOMEN: Soft without tenderness. No rigidity. No hepatosplenomegaly. No masses. No hernias.   SKIN: No ulcers. No subcutaneous nodules.   MUSCULOSKELETAL: No joint  swelling. No clubbing.   NEUROLOGIC: Cranial nerves II through XII are intact. No focal motor deficit. The patient is sleepy and lethargic, but arousable.   PSYCHIATRIC: The patient has advanced dementia, does not make sense and does not  respond to questions appropriately. Mood and affect were normal.   LABORATORY DATA: Serum glucose 101, BUN elevated at 39, creatinine 0.9, sodium elevated at 154, and potassium 3.9. Liver function tests showed normal protein and albumin. AST elevated at 98 and ALT 96. Troponin was 0.04 and 0.03. CBC showed white count of 9000, hemoglobin 16, hematocrit 46, and platelet count 171.   IMPRESSION:  1. Altered mental status likely secondary to medication induced by quetiapine however other etiologies are possible.  2. Dehydration.  3. Hypernatremia.  4. Mild elevation of liver transaminases.  5. Noticed inverted T wave by EKG in the anterolateral leads, but normal troponin.  6. History of paroxysmal atrial fibrillation, right now she is in sinus rhythm.  7. Alzheimer dementia.  8. Systemic hypertension.  9. Hyperlipidemia.  10. History of fibromyalgia and chronic fatigue syndrome.   PLAN: We will admit the patient to the medical floor with frequent neurologic evaluation and check-ups. Discontinue quetiapine. Monitor her mental status, hopefully will improve. Gentle IV hydration with half normal saline to correct the dehydration and hypernatremia. Monitor liver transaminases, to be repeated tomorrow after IV hydration, and also will follow up on her sodium, BUN, and creatinine. I will also check her TSH to ensure that she has adequate treatment for her hypothyroidism. I spoke with the patient's daughter. She indicated that the patient does have an advanced directive will, but she did not appoint anybody to have the power of attorney. The code status right now is FULL CODE.  TIME SPENT: Time Spent evaluating this patient and reviewing medical records took more than one hour.  ____________________________ Carney CornersAmir M. Rudene Rearwish, MD amd:slb D: 11/12/2011 01:33:55 ET T: 11/12/2011 09:03:27 ET JOB#: [id number]  cc: Carney CornersAmir M. Rudene Rearwish, MD, <Dictator> Kristopher OppenheimJane D. Shawn StallHollingsworth, MD Zollie ScaleAMIR M Janay Canan MD ELECTRONICALLY  SIGNED 11/12/2011 22:40

## 2014-07-22 NOTE — Consult Note (Signed)
    Comments   Please note that I was called back into the room by RN. Daughter says that she has changed her mind about code status and that she needs more time to pray about her decision prior to changing her mother's code status. At this time she requests full code. She says that if she changes her mind she will follow up with [facility] or hospice. She says the "Jesus will make the decision." status is FULL CODE.   Electronic Signatures: Borders, Daryl EasternJoshua R (NP)  (Signed [phone]-Nov-13 10:14)  Authored: Palliative Care   Last Updated: 27-Nov-13 10:14 by Malachy MoanBorders, Joshua R (NP)

## 2014-07-22 NOTE — Discharge Summary (Signed)
[name]:  Tubby, [name] MR#:  [id number] DATE OF BIRTH:  Aug 26, 1926  DATE OF ADMISSION:  11/12/2011 DATE OF DISCHARGE:  11/14/2011   PRIMARY CARE PHYSICIAN: Dr. Shawn StallHollingsworth   FINAL DIAGNOSES:  1. Encephalopathy secondary to Seroquel.  2. Hypernatremia.  3. Hypertension.  4. Paroxysmal atrial fibrillation.  5. Hypothyroidism.  6. End-stage dementia with failure to thrive.   MEDICATIONS ON DISCHARGE:  1. Sertraline 25 mg daily.  2. Metoprolol 50 mg 1.5 tablets twice a day.  3. Levothyroxine 88 mcg daily. 4. Tikosyn 250 mcg every 12 hours. 5. Senna lax 1 tab orally 2 to 3 times a week as needed for stool softener. 6. Tiazac 180 mg daily at bedtime. 7. Ensure Plus 237 mL 3 times a day.   DIET: Regular diet, regular consistency.   ACTIVITY: Activity as tolerated.   REFERRAL: Hospice at home.   FOLLOW-UP: Follow-up with Dr. Shawn StallHollingsworth within one week.   REASON FOR ADMISSION: The patient was admitted 11/12/2011 and discharged 11/14/2011. She came in with altered mental status.   HISTORY OF PRESENT ILLNESS: This is an 81 year old woman who lives at home with her daughter with severe Alzheimer's dementia, basically bedridden, was brought in with altered mental status after taking Seroquel. Hospitalist services were contacted for admission and admitted with altered mental status, dehydration, and hypernatremia.   LABORATORY AND RADIOLOGICAL DATA DURING THE HOSPITAL COURSE: Troponin was negative. Glucose 101, BUN 39, creatinine 0.95, sodium 154, potassium 3.9, chloride 119, CO2 29, calcium 9.1. Liver function tests ALT elevated at 96, AST elevated at 98. White blood cell count 9.3, hemoglobin and hematocrit 16.2 and 46.7, platelet count 171.   EKG showed sinus rhythm, sinus arrhythmia, PVC, left axis deviation, nonspecific ST-T wave changes.   Cardiac enzymes negative. TSH 0.98. Repeat liver function tests came back in the normal range with AST 26, ALT 48, albumin down at  2.8, total protein down at 5.6. On the 11th sodium was 151; on the 12th sodium 142.   HOSPITAL COURSE PER PROBLEM LIST:  1. For the patient's encephalopathy, I believe this is secondary to Seroquel. As per the daughter, the patient is back to her baseline mental status. Seroquel stopped. The patient is stable for discharge home with daughter.  2. For the patient's severe hypernatremia, the patient was given hydration initially. I switched the patient over to D5W drip and that improved the sodium. On the day of discharge sodium is now in the normal range. With the patient's history of dementia and poor oral intake, I believe the patient is high risk for hypernatremia again and dehydration again. I did recommend Hospice with the patient's daughter who is interested in that. This has to be approved by the [facility] program first.  3. Hypertension. Blood pressure stable during the hospitalization. No changes in meds.  4. Paroxysmal atrial fibrillation. The patient is on three medications to control heart rate.  5. Hypothyroidism. The patient is on levothyroxine.  6. End-stage dementia with failure to thrive. Daughter has trouble feeding the patient, basically only able to get liquids in her. High risk for dehydration and hypernatremia again. Hospice evaluation would be beneficial.   TIME SPENT ON DISCHARGE: 35 minutes.   ____________________________ Herschell Dimesichard J. Renae GlossWieting, MD rjw:drc D: 11/14/2011 15:55:13 ET T: 11/14/2011 17:14:15 ET JOB#: [id number] cc: Herschell Dimesichard J. Renae GlossWieting, MD, <Dictator>, Kristopher OppenheimJane D. Shawn StallHollingsworth, MD Salley ScarletICHARD J Camela Wich MD ELECTRONICALLY SIGNED 11/16/2011 15:25
